# Patient Record
Sex: Female | Born: 1990 | Race: Asian | Hispanic: No | Marital: Married | Smoking: Never smoker
Health system: Southern US, Community
[De-identification: ages and names within clinical notes are randomized; demographics above are authoritative.]

## PROBLEM LIST (undated history)

## (undated) DIAGNOSIS — Z789 Other specified health status: Secondary | ICD-10-CM

## (undated) HISTORY — PX: NO PAST SURGERIES: SHX2092

---

## 2020-03-25 ENCOUNTER — Encounter (HOSPITAL_COMMUNITY): Payer: Self-pay | Admitting: Emergency Medicine

## 2020-03-25 ENCOUNTER — Other Ambulatory Visit: Payer: Self-pay

## 2020-03-25 ENCOUNTER — Inpatient Hospital Stay (HOSPITAL_COMMUNITY)
Admission: AD | Admit: 2020-03-25 | Discharge: 2020-03-25 | Disposition: A | Discharge status: Home / Self Care | Payer: Managed Care, Other (non HMO) | Attending: Obstetrics & Gynecology | Admitting: Obstetrics & Gynecology

## 2020-03-25 ENCOUNTER — Inpatient Hospital Stay (HOSPITAL_COMMUNITY): Payer: Managed Care, Other (non HMO)

## 2020-03-25 DIAGNOSIS — Z3A01 Less than 8 weeks gestation of pregnancy: Secondary | ICD-10-CM | POA: Insufficient documentation

## 2020-03-25 DIAGNOSIS — Z6790 Unspecified blood type, Rh positive: Secondary | ICD-10-CM

## 2020-03-25 DIAGNOSIS — O209 Hemorrhage in early pregnancy, unspecified: Secondary | ICD-10-CM | POA: Diagnosis not present

## 2020-03-25 DIAGNOSIS — O3680X0 Pregnancy with inconclusive fetal viability, not applicable or unspecified: Secondary | ICD-10-CM

## 2020-03-25 DIAGNOSIS — O4690 Antepartum hemorrhage, unspecified, unspecified trimester: Secondary | ICD-10-CM

## 2020-03-25 HISTORY — DX: Other specified health status: Z78.9

## 2020-03-25 LAB — WET PREP, GENITAL
Clue Cells Wet Prep HPF POC: NONE SEEN
Sperm: NONE SEEN
Trich, Wet Prep: NONE SEEN
Yeast Wet Prep HPF POC: NONE SEEN

## 2020-03-25 LAB — I-STAT BETA HCG BLOOD, ED (MC, WL, AP ONLY): I-stat hCG, quantitative: 52.4 m[IU]/mL — ABNORMAL HIGH (ref <5)

## 2020-03-25 LAB — ABO/RH: ABO/RH(D): O POS

## 2020-03-25 LAB — URINALYSIS, ROUTINE W REFLEX MICROSCOPIC
Bilirubin Urine: NEGATIVE
Glucose, UA: NEGATIVE mg/dL
Ketones, ur: NEGATIVE mg/dL
Nitrite: NEGATIVE
Protein, ur: 100 mg/dL — AB
RBC / HPF: >50 RBC/hpf — ABNORMAL HIGH (ref 0–5)
Specific Gravity, Urine: 1.025 (ref 1.005–1.030)
pH: 6 (ref 5.0–8.0)

## 2020-03-25 LAB — CBC
HCT: 38.5 % (ref 36.0–46.0)
Hemoglobin: 12.9 g/dL (ref 12.0–15.0)
MCH: 30.6 pg (ref 26.0–34.0)
MCHC: 33.5 g/dL (ref 30.0–36.0)
MCV: 91.2 fL (ref 80.0–100.0)
Platelets: 236 10*3/uL (ref 150–400)
RBC: 4.22 MIL/uL (ref 3.87–5.11)
RDW: 11.7 % (ref 11.5–15.5)
WBC: 9.2 10*3/uL (ref 4.0–10.5)
nRBC: 0 % (ref 0.0–0.2)

## 2020-03-25 LAB — HCG, QUANTITATIVE, PREGNANCY: hCG, Beta Chain, Quant, S: 73 m[IU]/mL — ABNORMAL HIGH (ref <5)

## 2020-03-25 NOTE — Discharge Instructions (Signed)

## 2020-03-25 NOTE — ED Triage Notes (Signed)
Pt reports she took a home pregnancy test twice at home 2 weeks ago - both positive. LMP 02/07/20. Pt reports her first obgyn appointment is for May 17th. Pt reports she started having light bleeding yesterday. Today she started having some cramping, lower back pain and increased vaginal bleeding. No clots noted.

## 2020-03-25 NOTE — MAU Note (Signed)
+  HPT.  Started spotting and cramping yesterday, became worse today.

## 2020-03-25 NOTE — ED Provider Notes (Signed)
44 old female G1P0  approximately [redacted] weeks pregnant by dates presents for evaluation of lower abdominal pain and vaginal bleeding.  Began yesterday.  Denies any lightheadedness or dizziness.  She has not been seen by OB/GYN to establish IUP.  Patient appears overall well.  Hemodynamically stable.  Abdomen soft.  I-STAT hCG here in ED positive.  Consult with MAU provider, Erin who agrees to except patient in transfer.   MSE was initiated and I personally evaluated the patient and placed orders (if any) at  5:26 PM on Mar 25, 2020.  The patient appears stable so that the remainder of the MSE may be completed by another provider.   Kaytlen Lightsey A, PA-C 03/25/20 1726    Little, Ambrose Finland, MD 03/25/20 2213

## 2020-03-25 NOTE — MAU Provider Note (Signed)
History     CSN: [id number]  Arrival date and time: 03/25/20 1610   First Provider Initiated Contact with Patient 03/25/20 1812      Chief Complaint  Patient presents with  . Vaginal Bleeding  . Abdominal Pain   29 y.o. G1 @[redacted]w[redacted]d  presenting with VB. Had pink spotting last night then became red and heavier today. She only sees blood when she wipes. Reports menstrual like cramps today. Rates pain 4/10. Has not taken anything for it. Had sex 2 days ago.   OB History    Gravida  1   Para      Term      Preterm      AB      Living        SAB      TAB      Ectopic      Multiple      Live Births              Past Medical History:  Diagnosis Date  . Medical history non-contributory     Past Surgical History:  Procedure Laterality Date  . NO PAST SURGERIES      Family History  Problem Relation Age of Onset  . Asthma Mother   . Healthy Father     Social History   Tobacco Use  . Smoking status: Never Smoker  . Smokeless tobacco: Never Used  Substance Use Topics  . Alcohol use: Never  . Drug use: Never    Allergies: No Known Allergies  Medications Prior to Admission  Medication Sig Dispense Refill Last Dose  . prenatal vitamin w/FE, FA (PRENATAL 1 + 1) 27-1 MG TABS tablet Take 1 tablet by mouth daily at 12 noon.   03/25/2020 at Unknown time    Review of Systems  Gastrointestinal: Positive for abdominal pain.  Genitourinary: Positive for vaginal bleeding.   Physical Exam   Blood pressure 107/69, pulse 83, temperature 97.9 F (36.6 C), temperature source Oral, resp. rate 20, height 5\' 5"  (1.651 m), weight 30.8 kg, last menstrual period 02/07/2020, SpO2 100 %.  Physical Exam  Nursing note and vitals reviewed. Constitutional: She is oriented to person, place, and time. She appears well-developed and well-nourished. No distress (appears comfortable).  HENT:  Head: Normocephalic and atraumatic.  Cardiovascular: Normal rate.  Respiratory: Effort  normal. No respiratory distress.  GI: Soft. She exhibits no distension and no mass. There is no abdominal tenderness. There is no rebound and no guarding.  Genitourinary:    Genitourinary Comments: External: no lesions or erythema Vagina: rugated, pink, moist, scant red bloody discharge, cleared with 1 fox swab Uterus: non enlarged, retroverted, non tender, no CMT Adnexae: no masses, no tenderness left, no tenderness right Cervix closed    Musculoskeletal:        General: Normal range of motion.     Cervical back: Normal range of motion.  Neurological: She is alert and oriented to person, place, and time.  Skin: Skin is warm and dry.  Psychiatric: She has a normal mood and affect.   Results for orders placed or performed during the hospital encounter of 03/25/20 (from the past 24 hour(s))  I-Stat beta hCG blood, ED     Status: Abnormal   Collection Time: 03/25/20  5:07 PM  Result Value Ref Range   I-stat hCG, quantitative 52.4 (H) <5 mIU/mL   Comment 3          Wet prep, genital     Status:  Abnormal   Collection Time: 03/25/20  6:28 PM  Result Value Ref Range   Yeast Wet Prep HPF POC NONE SEEN NONE SEEN   Trich, Wet Prep NONE SEEN NONE SEEN   Clue Cells Wet Prep HPF POC NONE SEEN NONE SEEN   WBC, Wet Prep HPF POC FEW (A) NONE SEEN   Sperm NONE SEEN   Urinalysis, Routine w reflex microscopic     Status: Abnormal   Collection Time: 03/25/20  6:53 PM  Result Value Ref Range   Color, Urine YELLOW YELLOW   APPearance CLOUDY (A) CLEAR   Specific Gravity, Urine 1.025 1.005 - 1.030   pH 6.0 5.0 - 8.0   Glucose, UA NEGATIVE NEGATIVE mg/dL   Hgb urine dipstick LARGE (A) NEGATIVE   Bilirubin Urine NEGATIVE NEGATIVE   Ketones, ur NEGATIVE NEGATIVE mg/dL   Protein, ur 671 (A) NEGATIVE mg/dL   Nitrite NEGATIVE NEGATIVE   Leukocytes,Ua TRACE (A) NEGATIVE   RBC / HPF >50 (H) 0 - 5 RBC/hpf   WBC, UA 6-10 0 - 5 WBC/hpf   Bacteria, UA RARE (A) NONE SEEN   Squamous Epithelial / LPF 0-5 0 -  5  CBC     Status: None   Collection Time: 03/25/20  6:55 PM  Result Value Ref Range   WBC 9.2 4.0 - 10.5 K/uL   RBC 4.22 3.87 - 5.11 MIL/uL   Hemoglobin 12.9 12.0 - 15.0 g/dL   HCT 24.5 80.9 - 98.3 %   MCV 91.2 80.0 - 100.0 fL   MCH 30.6 26.0 - 34.0 pg   MCHC 33.5 30.0 - 36.0 g/dL   RDW 38.2 50.5 - 39.7 %   Platelets 236 150 - 400 K/uL   nRBC 0.0 0.0 - 0.2 %  hCG, quantitative, pregnancy     Status: Abnormal   Collection Time: 03/25/20  6:55 PM  Result Value Ref Range   hCG, Beta Chain, Quant, S 73 (H) <5 mIU/mL  ABO/Rh     Status: None   Collection Time: 03/25/20  6:55 PM  Result Value Ref Range   ABO/RH(D) O POS    No rh immune globuloin      NOT A RH IMMUNE GLOBULIN CANDIDATE, PT RH POSITIVE Performed at [facility], [address] [place], [place] [address]    US OB LESS THAN 14 WEEKS WITH OB TRANSVAGINAL  Result Date: 03/25/2020 CLINICAL DATA:  Vaginal bleeding, quantitative beta HCG 52 EXAM: OBSTETRIC <14 WK [place] AND TRANSVAGINAL OB US TECHNIQUE: Both transabdominal and transvaginal ultrasound examinations were performed for complete evaluation of the gestation as well as the maternal uterus, adnexal regions, and pelvic cul-de-sac. Transvaginal technique was performed to assess early pregnancy. COMPARISON:  None. FINDINGS: Intrauterine gestational sac: None Maternal uterus/adnexae: Uterus is retroverted and unremarkable. Endometrium measures 8 mm. There are no adnexal masses. Left ovary measures 2.7 by 1.9 x 1.7 cm and the right ovary measures 3.0 x 1.8 by 2.1 cm. There is trace free fluid in the cul-de-sac. IMPRESSION: 1. No evidence of live intrauterine pregnancy at this time, not unexpected given low beta hCG levels. Serial beta HCG measurements and follow-up ultrasound may be needed to document live intrauterine pregnancy. 2. No adnexal masses or evidence of ectopic pregnancy. 3. Trace free fluid likely physiologic. Electronically Signed   By: Sharlet Salina M.D.   On:  03/25/2020 19:34   MAU Course  Procedures  MDM Labs and [place] ordered. No IUP or adnexal mass seen on [place],  findings could indicate early pregnancy, ectopic pregnancy, or failed pregnancy-discussed with pt and spouse. Will follow quant in 48 hrs. Stable for discharge home.   Assessment and Plan   1. Pregnancy, location unknown   2. Vaginal bleeding in pregnancy   3. Blood type, Rh positive    Discharge home Follow up at [facility] on 03/28/20 at 0930 for stat qhcg Ectopic/SAB precautions Pelvic rest  Allergies as of 03/25/2020   No Known Allergies     Medication List    TAKE these medications   prenatal vitamin w/FE, FA 27-1 MG Tabs tablet Take 1 tablet by mouth daily at 12 noon.      Julianne Handler, CNM 03/25/2020, 8:02 PM

## 2020-03-26 LAB — GC/CHLAMYDIA PROBE AMP (~~LOC~~) NOT AT ARMC
Chlamydia: NEGATIVE
Comment: NEGATIVE
Comment: NORMAL
Neisseria Gonorrhea: NEGATIVE

## 2020-03-28 ENCOUNTER — Ambulatory Visit: Payer: Managed Care, Other (non HMO)

## 2020-04-07 ENCOUNTER — Encounter: Payer: Self-pay | Admitting: Obstetrics & Gynecology

## 2020-04-07 DIAGNOSIS — Z01419 Encounter for gynecological examination (general) (routine) without abnormal findings: Secondary | ICD-10-CM

## 2020-04-10 ENCOUNTER — Inpatient Hospital Stay (HOSPITAL_COMMUNITY)
Admission: AD | Admit: 2020-04-10 | Discharge: 2020-04-10 | Disposition: A | Discharge status: Home / Self Care | Payer: Managed Care, Other (non HMO) | Attending: Obstetrics and Gynecology | Admitting: Obstetrics and Gynecology

## 2020-04-10 ENCOUNTER — Other Ambulatory Visit: Payer: Self-pay

## 2020-04-10 DIAGNOSIS — Z3A00 Weeks of gestation of pregnancy not specified: Secondary | ICD-10-CM | POA: Insufficient documentation

## 2020-04-10 DIAGNOSIS — O00201 Right ovarian pregnancy without intrauterine pregnancy: Secondary | ICD-10-CM | POA: Insufficient documentation

## 2020-04-10 LAB — COMPREHENSIVE METABOLIC PANEL
ALT: 29 U/L (ref 0–44)
AST: 24 U/L (ref 15–41)
Albumin: 4.1 g/dL (ref 3.5–5.0)
Alkaline Phosphatase: 55 U/L (ref 38–126)
Anion gap: 8 (ref 5–15)
BUN: 17 mg/dL (ref 6–20)
CO2: 25 mmol/L (ref 22–32)
Calcium: 9.3 mg/dL (ref 8.9–10.3)
Chloride: 102 mmol/L (ref 98–111)
Creatinine, Ser: 0.75 mg/dL (ref 0.44–1.00)
GFR calc Af Amer: >60 mL/min (ref >60)
GFR calc non Af Amer: >60 mL/min (ref >60)
Glucose, Bld: 100 mg/dL — ABNORMAL HIGH (ref 70–99)
Potassium: 4.3 mmol/L (ref 3.5–5.1)
Sodium: 135 mmol/L (ref 135–145)
Total Bilirubin: 1.3 mg/dL — ABNORMAL HIGH (ref 0.3–1.2)
Total Protein: 7.8 g/dL (ref 6.5–8.1)

## 2020-04-10 LAB — CBC WITH DIFFERENTIAL/PLATELET
Abs Immature Granulocytes: 0.04 10*3/uL (ref 0.00–0.07)
Basophils Absolute: 0 10*3/uL (ref 0.0–0.1)
Basophils Relative: 0 %
Eosinophils Absolute: 0.3 10*3/uL (ref 0.0–0.5)
Eosinophils Relative: 4 %
HCT: 39.3 % (ref 36.0–46.0)
Hemoglobin: 12.9 g/dL (ref 12.0–15.0)
Immature Granulocytes: 1 %
Lymphocytes Relative: 21 %
Lymphs Abs: 1.7 10*3/uL (ref 0.7–4.0)
MCH: 30.1 pg (ref 26.0–34.0)
MCHC: 32.8 g/dL (ref 30.0–36.0)
MCV: 91.6 fL (ref 80.0–100.0)
Monocytes Absolute: 0.6 10*3/uL (ref 0.1–1.0)
Monocytes Relative: 7 %
Neutro Abs: 5.3 10*3/uL (ref 1.7–7.7)
Neutrophils Relative %: 67 %
Platelets: 251 10*3/uL (ref 150–400)
RBC: 4.29 MIL/uL (ref 3.87–5.11)
RDW: 11.7 % (ref 11.5–15.5)
WBC: 8 10*3/uL (ref 4.0–10.5)
nRBC: 0 % (ref 0.0–0.2)

## 2020-04-10 LAB — HCG, QUANTITATIVE, PREGNANCY: hCG, Beta Chain, Quant, S: 122 m[IU]/mL — ABNORMAL HIGH (ref <5)

## 2020-04-10 MED ORDER — METHOTREXATE FOR ECTOPIC PREGNANCY
50.0000 mg/m2 | Freq: Once | INTRAMUSCULAR | Status: AC
  Administered 2020-04-10: 95 mg via INTRAMUSCULAR
  Filled 2020-04-10: qty 1

## 2020-04-10 NOTE — MAU Note (Signed)
PT is here for Methotrexate

## 2020-04-13 ENCOUNTER — Inpatient Hospital Stay (HOSPITAL_COMMUNITY)
Admission: AD | Admit: 2020-04-13 | Discharge: 2020-04-13 | Disposition: A | Discharge status: Home / Self Care | Payer: Managed Care, Other (non HMO) | Attending: Obstetrics & Gynecology | Admitting: Obstetrics & Gynecology

## 2020-04-13 DIAGNOSIS — O3680X0 Pregnancy with inconclusive fetal viability, not applicable or unspecified: Secondary | ICD-10-CM | POA: Diagnosis present

## 2020-04-13 DIAGNOSIS — O209 Hemorrhage in early pregnancy, unspecified: Secondary | ICD-10-CM | POA: Diagnosis not present

## 2020-04-13 DIAGNOSIS — Z3A00 Weeks of gestation of pregnancy not specified: Secondary | ICD-10-CM | POA: Diagnosis not present

## 2020-04-13 DIAGNOSIS — Z9221 Personal history of antineoplastic chemotherapy: Secondary | ICD-10-CM

## 2020-04-13 LAB — HCG, QUANTITATIVE, PREGNANCY: hCG, Beta Chain, Quant, S: 114 m[IU]/mL — ABNORMAL HIGH (ref <5)

## 2020-04-13 NOTE — MAU Provider Note (Signed)
History   Chief Complaint:  Labs Only   Jessica Hughes is  29 y.o. G1P0 Patient's last menstrual period was 02/07/2020.Jessica Hughes Patient is here for follow up of quantitative HCG and ongoing surveillance of pregnancy status. She is Unknown weeks gestation  by LMP.    Since her last visit, the patient is without new complaint. The patient reports bleeding as  lighter than period.  She denies any pain.  General ROS:  positive for vaginal bleeding  Her previous Quantitative HCG values are:  Results for LATOY, LABRIOLA (MRN [id number]) as of 04/13/2020 13:13  Ref. Range 03/25/2020 18:55 04/10/2020 12:52  HCG, Beta Chain, Quant, S Latest Ref Range: <5 mIU/mL 73 (H) 122 (H)   Physical Exam   Blood pressure 106/64, pulse 80, temperature 98.5 F (36.9 C), temperature source Oral, resp. rate 16, last menstrual period 02/07/2020, SpO2 100 %.  Focused Gynecological Exam: examination not indicated  Labs: Results for orders placed or performed during the hospital encounter of 04/13/20 (from the past 24 hour(s))  hCG, quantitative, pregnancy   Collection Time: 04/13/20 12:16 PM  Result Value Ref Range   hCG, Beta Chain, Quant, S 114 (H) <5 mIU/mL   Reviewed HCG with Dr. Alysia Penna- ok to discharge home and follow up on Day 7 as scheduled. Patient states she was instructed by MD to come to MAU for day 7 labs.  Assessment:   1. Pregnancy of unknown anatomic location   2. History of methotrexate therapy     Plan: -Discharge home in stable condition -Strict ectopic precautions discussed -Patient advised to follow-up with MAU on Wednesday for day 7 labs -Patient may return to MAU as needed or if her condition were to change or worsen  Rolm Bookbinder, CNM 04/13/2020, 1:13 PM

## 2020-04-13 NOTE — MAU Note (Signed)
Pt had to leave. Will call her with results or if she needs to return before day 7. Provider aware.

## 2020-04-13 NOTE — MAU Note (Signed)
Pt is here for repeat HCG (day 4). Reports some period-like bleeding since yesterday. Denies pain.

## 2020-04-14 ENCOUNTER — Inpatient Hospital Stay (HOSPITAL_COMMUNITY)
Admission: AD | Admit: 2020-04-14 | Discharge: 2020-04-14 | Disposition: A | Discharge status: Home / Self Care | Payer: Managed Care, Other (non HMO) | Attending: Obstetrics and Gynecology | Admitting: Obstetrics and Gynecology

## 2020-04-14 ENCOUNTER — Other Ambulatory Visit: Payer: Self-pay

## 2020-04-14 ENCOUNTER — Inpatient Hospital Stay (HOSPITAL_COMMUNITY): Payer: Managed Care, Other (non HMO)

## 2020-04-14 ENCOUNTER — Encounter (HOSPITAL_COMMUNITY): Payer: Self-pay | Admitting: Obstetrics and Gynecology

## 2020-04-14 DIAGNOSIS — O26891 Other specified pregnancy related conditions, first trimester: Secondary | ICD-10-CM

## 2020-04-14 DIAGNOSIS — O009 Unspecified ectopic pregnancy without intrauterine pregnancy: Secondary | ICD-10-CM | POA: Diagnosis not present

## 2020-04-14 DIAGNOSIS — M545 Low back pain: Secondary | ICD-10-CM | POA: Insufficient documentation

## 2020-04-14 DIAGNOSIS — Z3A00 Weeks of gestation of pregnancy not specified: Secondary | ICD-10-CM | POA: Diagnosis not present

## 2020-04-14 DIAGNOSIS — R109 Unspecified abdominal pain: Secondary | ICD-10-CM | POA: Diagnosis not present

## 2020-04-14 LAB — HEMOGLOBIN AND HEMATOCRIT, BLOOD
HCT: 37.4 % (ref 36.0–46.0)
Hemoglobin: 12.3 g/dL (ref 12.0–15.0)

## 2020-04-14 LAB — HCG, QUANTITATIVE, PREGNANCY: hCG, Beta Chain, Quant, S: 92 m[IU]/mL — ABNORMAL HIGH (ref <5)

## 2020-04-14 NOTE — MAU Note (Signed)
Pt had methotrexate last Thursday. Stated she had some increased bleeding like a period the last couple of days and some today but it stopped this afternoon. Reports right sided lower back pain rating it a 4/10. States this worried her and is what made her want to come be seen. States the pain comes and goes. Has not taken any medication for the pain.

## 2020-04-14 NOTE — MAU Provider Note (Signed)
Chief Complaint: Abdominal Pain   First Provider Initiated Contact with Patient 04/14/20 1937     SUBJECTIVE HPI: Jessica Hughes is a 29 y.o. G1P0 at Unknown who presents to Maternity Admissions reporting abdominal pain and low back pain.  She received methotrexate on Thursday for a right ectopic pregnancy.  Had abnormally rising hCGs and on ultrasound in the office had a right adnexal mass measuring 1.6 cm (per patient).  At 6 PM this evening reports sudden onset right-sided abdominal and low back pain.  Has also had some bleeding since yesterday.  Not saturating pads or passing blood clots.  Location: RLQ & back Quality: aching   Severity: 4/10 on pain scale Duration: 2 hours Timing: intermittent Modifying factors: none Associated signs and symptoms: vaginal bleeding  Past Medical History:  Diagnosis Date  . Medical history non-contributory    OB History  Gravida Para Term Preterm AB Living  1            SAB TAB Ectopic Multiple Live Births               # Outcome Date GA Lbr Len/2nd Weight Sex Delivery Anes PTL Lv  1 Current            Past Surgical History:  Procedure Laterality Date  . NO PAST SURGERIES     Social History   Socioeconomic History  . Marital status: Married    Spouse name: Not on file  . Number of children: Not on file  . Years of education: Not on file  . Highest education level: Not on file  Occupational History  . Not on file  Tobacco Use  . Smoking status: Never Smoker  . Smokeless tobacco: Never Used  Substance and Sexual Activity  . Alcohol use: Never  . Drug use: Never  . Sexual activity: Yes  Other Topics Concern  . Not on file  Social History Narrative  . Not on file   Social Determinants of Health   Financial Resource Strain:   . Difficulty of Paying Living Expenses:   Food Insecurity:   . Worried About Programme researcher, broadcasting/film/video in the Last Year:   . Barista in the Last Year:   Transportation Needs:   . Automotive engineer (Medical):   Marland Kitchen Lack of Transportation (Non-Medical):   Physical Activity:   . Days of Exercise per Week:   . Minutes of Exercise per Session:   Stress:   . Feeling of Stress :   Social Connections:   . Frequency of Communication with Friends and Family:   . Frequency of Social Gatherings with Friends and Family:   . Attends Religious Services:   . Active Member of Clubs or Organizations:   . Attends Banker Meetings:   Marland Kitchen Marital Status:   Intimate Partner Violence:   . Fear of Current or Ex-Partner:   . Emotionally Abused:   Marland Kitchen Physically Abused:   . Sexually Abused:    Family History  Problem Relation Age of Onset  . Asthma Mother   . Healthy Father    No current facility-administered medications on file prior to encounter.   Current Outpatient Medications on File Prior to Encounter  Medication Sig Dispense Refill  . prenatal vitamin w/FE, FA (PRENATAL 1 + 1) 27-1 MG TABS tablet Take 1 tablet by mouth daily at 12 noon.     No Known Allergies  I have reviewed patient's Past Medical Hx, Surgical Hx, Family  Hx, Social Hx, medications and allergies.   Review of Systems  Constitutional: Negative.   Gastrointestinal: Positive for abdominal pain.  Genitourinary: Positive for vaginal bleeding.  Musculoskeletal: Positive for back pain.    OBJECTIVE Patient Vitals for the past 24 hrs:  BP Temp Temp src Pulse Resp SpO2 Height Weight  04/14/20 1911 -- -- -- -- -- 100 % -- --  04/14/20 1909 111/65 -- -- 83 17 -- -- --  04/14/20 1852 -- -- -- -- -- -- 5\' 4"  (1.626 m) 77.6 kg  04/14/20 1848 106/73 98.3 F (36.8 C) Oral 85 17 -- -- --  04/14/20 1847 -- -- -- -- -- 100 % -- --   Constitutional: Well-developed, well-nourished female in no acute distress.  Cardiovascular: normal rate & rhythm, no murmur Respiratory: normal rate and effort. Lung sounds clear throughout GI: Abd soft, non-tender, Pos BS x 4. No guarding or rebound tenderness MS:  Extremities nontender, no edema, normal ROM Neurologic: Alert and oriented x 4.      LAB RESULTS Results for orders placed or performed during the hospital encounter of 04/14/20 (from the past 24 hour(s))  Hemoglobin and hematocrit, blood     Status: None   Collection Time: 04/14/20  7:53 PM  Result Value Ref Range   Hemoglobin 12.3 12.0 - 15.0 g/dL   HCT 04/16/20 76.7 - 34.1 %  hCG, quantitative, pregnancy     Status: Abnormal   Collection Time: 04/14/20  7:53 PM  Result Value Ref Range   hCG, Beta Chain, Quant, S 92 (H) <5 mIU/mL    IMAGING 04/16/20 OB Transvaginal  Result Date: 04/14/2020 CLINICAL DATA:  Ectopic pregnancy. Abdominal pain in first trimester pregnancy. Patient received methotrexate, most recently 04/10/2020. EXAM: TRANSVAGINAL OB ULTRASOUND TECHNIQUE: Transvaginal ultrasound was performed for complete evaluation of the gestation as well as the maternal uterus, adnexal regions, and pelvic cul-de-sac. COMPARISON:  Obstetric ultrasound 03/25/2020 FINDINGS: Intrauterine gestational sac: None Yolk sac:  Not Visualized. Embryo:  Not Visualized. Maternal uterus/adnexae: Uterus remains retroverted. Endometrium is thin measuring 5 mm. No fluid or gestational sac in the endometrial canal. The left ovary appears normal measuring 1.7 x 2.3 x 1.6 cm. The right ovary appears normal measuring 3.2 x 1.8 x 3.5 cm. No ovarian or extra ovarian adnexal mass. No significant pelvic free fluid. IMPRESSION: No intrauterine pregnancy. No findings to suggest ectopic pregnancy. No adnexal mass or pelvic free fluid. Electronically Signed   By: 05/25/2020 M.D.   On: 04/14/2020 20:43    MAU COURSE Orders Placed This Encounter  Procedures  . 04/16/2020 OB Transvaginal  . Hemoglobin and hematocrit, blood  . hCG, quantitative, pregnancy  . Diet NPO time specified  . Discharge patient   No orders of the defined types were placed in this encounter.   MDM Patient stable with benign abdominal exam.  Patient  very concerned due to the cost sudden onset of pain and recent ectopic treatment.  Offered labs and ultrasound today.  Ultrasound shows no IUP or adnexal masses.  hCG today down to 92 from 114 yesterday.  We will continue with plan of day 7 hCG and MAU on Wednesday ASSESSMENT 1. Ectopic pregnancy without intrauterine pregnancy, unspecified location   2. Abdominal pain during pregnancy in first trimester     PLAN Discharge home in stable condition. Ectopic precautions Return to MAU on Wednesday for day 7 HCG  Follow-up Information    [facility] Follow up.   Specialty: Obstetrics  and Gynecology Why: Return on Wednesday for labs or sooner for worsening symptoms Contact information: [address] [id number] [place] [phone]         Allergies as of 04/14/2020   No Known Allergies     Medication List    STOP taking these medications   prenatal vitamin w/FE, FA 27-1 MG Tabs tablet        Jorje Guild, NP 04/14/2020  9:00 PM

## 2020-04-14 NOTE — Discharge Instructions (Signed)

## 2020-04-16 ENCOUNTER — Inpatient Hospital Stay (HOSPITAL_COMMUNITY)
Admission: AD | Admit: 2020-04-16 | Discharge: 2020-04-16 | Disposition: A | Discharge status: Home / Self Care | Payer: Managed Care, Other (non HMO) | Attending: Obstetrics and Gynecology | Admitting: Obstetrics and Gynecology

## 2020-04-16 ENCOUNTER — Other Ambulatory Visit: Payer: Self-pay

## 2020-04-16 DIAGNOSIS — O009 Unspecified ectopic pregnancy without intrauterine pregnancy: Secondary | ICD-10-CM

## 2020-04-16 DIAGNOSIS — Z3A00 Weeks of gestation of pregnancy not specified: Secondary | ICD-10-CM | POA: Insufficient documentation

## 2020-04-16 LAB — HCG, QUANTITATIVE, PREGNANCY: hCG, Beta Chain, Quant, S: 71 m[IU]/mL — ABNORMAL HIGH (ref <5)

## 2020-04-16 NOTE — MAU Provider Note (Signed)
Subjective:  Jessica Hughes is a 29 y.o. G1P0 at Unknown who presents today for FU BHCG. She was seen on Apr 13, 2020 for initial MTX dose. Results from that day show no IUP on [place], and HCG 114. She denies vaginal bleeding. She denies abdominal or pelvic pain.   Objective:  Physical Exam  Nursing note and vitals reviewed. Constitutional: She is oriented to person, place, and time. She appears well-developed and well-nourished. No distress.  HENT:  Head: Normocephalic.  Cardiovascular: Normal rate.  Respiratory: Effort normal.  GI: Soft. There is no tenderness.  Neurological: She is alert and oriented to person, place, and time. Skin: Skin is warm and dry.  Psychiatric: She has a normal mood and affect.   Results for orders placed or performed during the hospital encounter of 04/16/20 (from the past 24 hour(s))  hCG, quantitative, pregnancy     Status: Abnormal   Collection Time: 04/16/20  6:01 PM  Result Value Ref Range   hCG, Beta Chain, Quant, S 71 (H) <5 mIU/mL    Assessment/Plan: Ectopic Pregnancy HCG did  Decrease  Appropriately Patient and SO informed of results.  No questions or concerns.  FU in 7 days for: Repeat quant at [facility].  Patient already scheduled and reports appt on June 2nd with Dr. Delrae Sawyers

## 2020-04-16 NOTE — MAU Note (Signed)
.  Jessica Hughes is a 29 y.o.  here in MAU reporting: she is here for day 7 blood work after methotrexate. Denies any pain or vaginal bleeding   Pain score: 0 There were no vitals filed for this visit.   Lab orders placed from triage: QUANT

## 2020-04-16 NOTE — MAU Note (Signed)
Gerrit Heck CNM discussed lab results and POC with pt. CNM gave pt verbal d/c instructions and pt d/c home.

## 2020-06-24 ENCOUNTER — Other Ambulatory Visit: Payer: Self-pay | Admitting: Obstetrics and Gynecology

## 2020-06-24 DIAGNOSIS — Z8759 Personal history of other complications of pregnancy, childbirth and the puerperium: Secondary | ICD-10-CM

## 2020-06-30 ENCOUNTER — Other Ambulatory Visit: Payer: Managed Care, Other (non HMO)

## 2020-09-28 IMAGING — US US OB TRANSVAGINAL
1 series · 15 of 26 positions shown · non-contrast
Comparison: Obstetric ultrasound 03/25/2020

CLINICAL DATA: Ectopic pregnancy. Abdominal pain in first trimester
pregnancy. Patient received methotrexate, most recently 04/10/2020.

EXAM:
TRANSVAGINAL OB ULTRASOUND
TECHNIQUE: Transvaginal ultrasound was performed for complete evaluation of the
gestation as well as the maternal uterus, adnexal regions, and
pelvic cul-de-sac.

[Series 1: us ob transvaginal · 26 acquisitions, 15 frames shown]
[im 1/26]
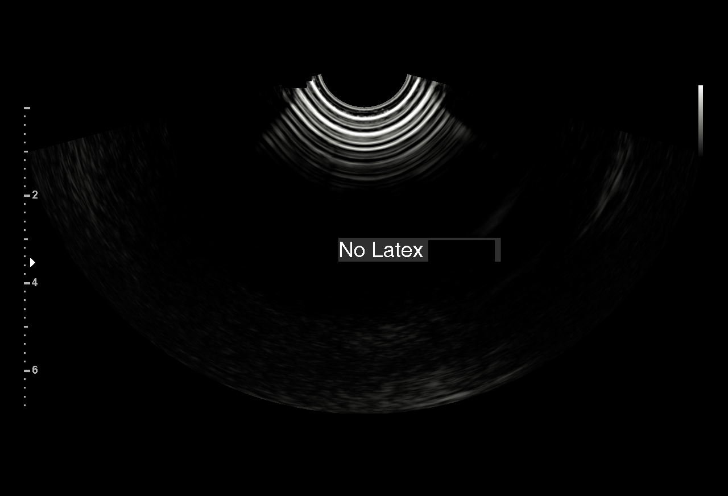
[im 3/26]
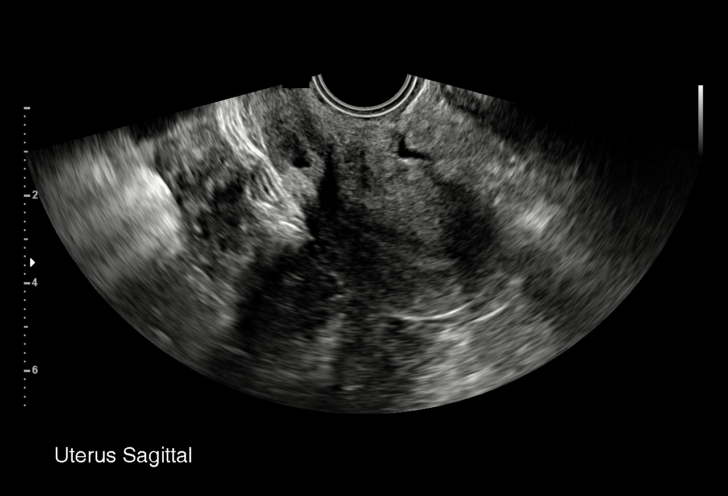
[im 5/26]
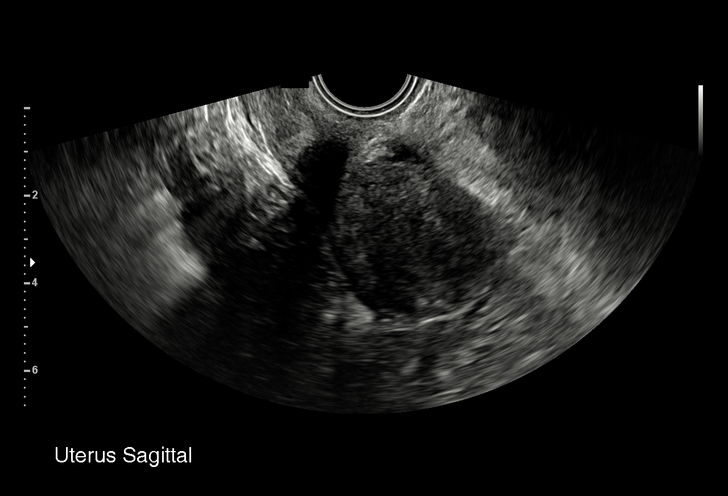
[im 7/26]
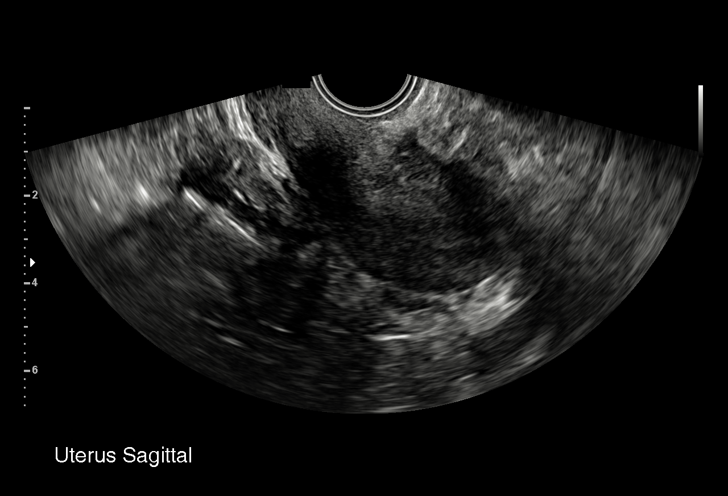
[im 8/26]
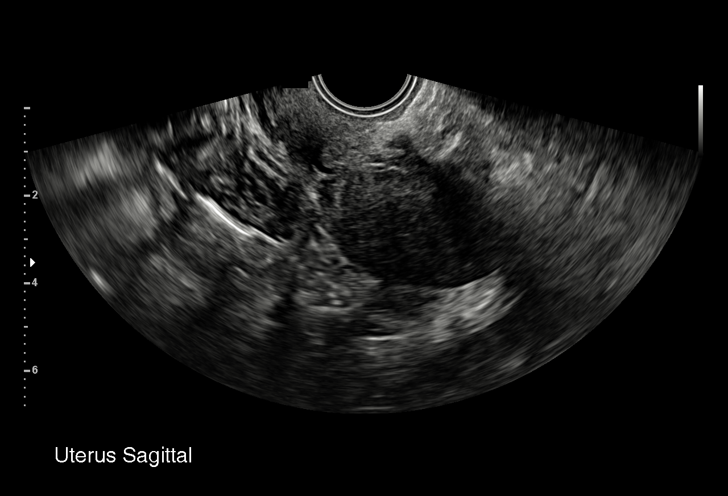
[im 10/26]
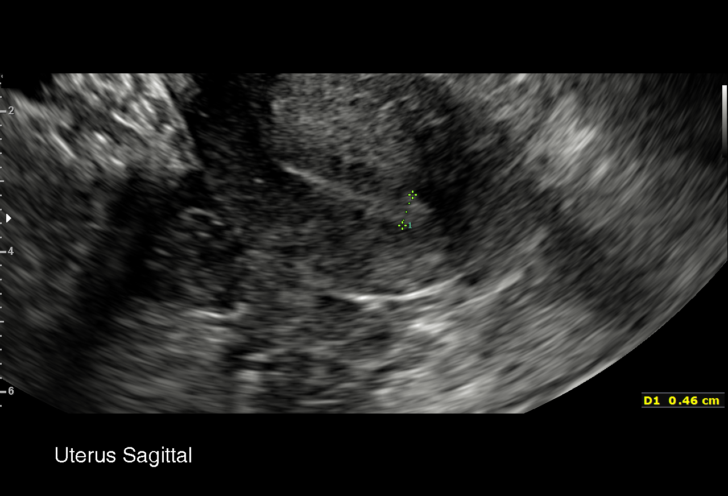
[im 12/26]
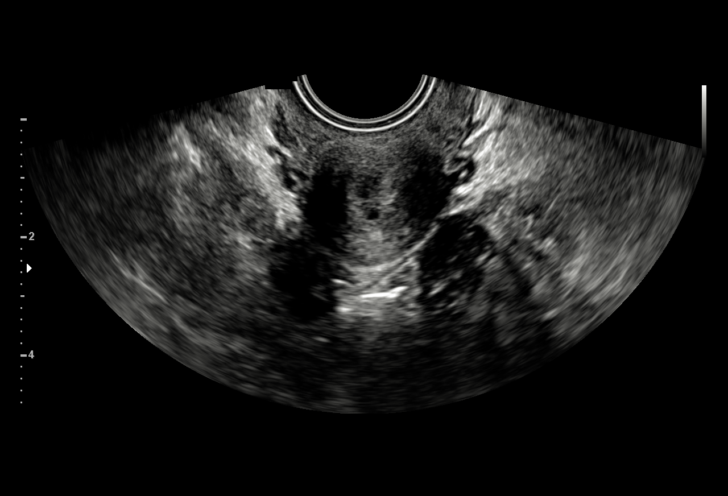
[im 14/26]
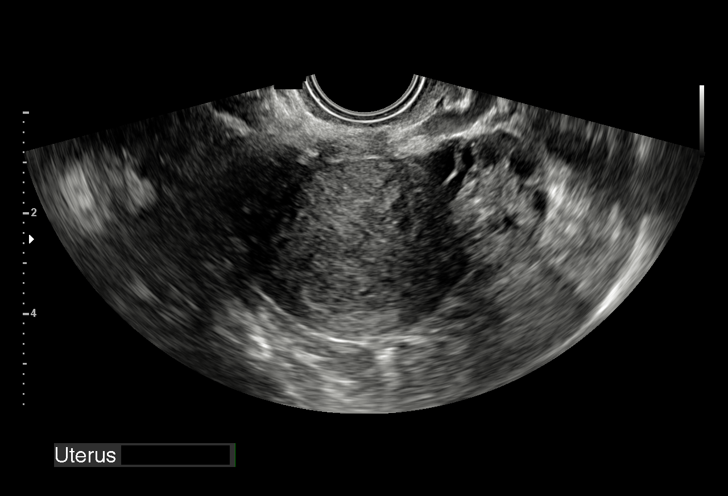
[im 15/26]
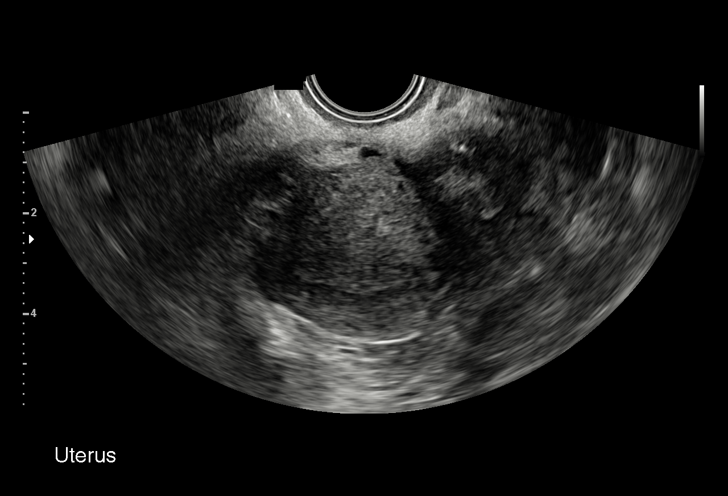
[im 17/26]
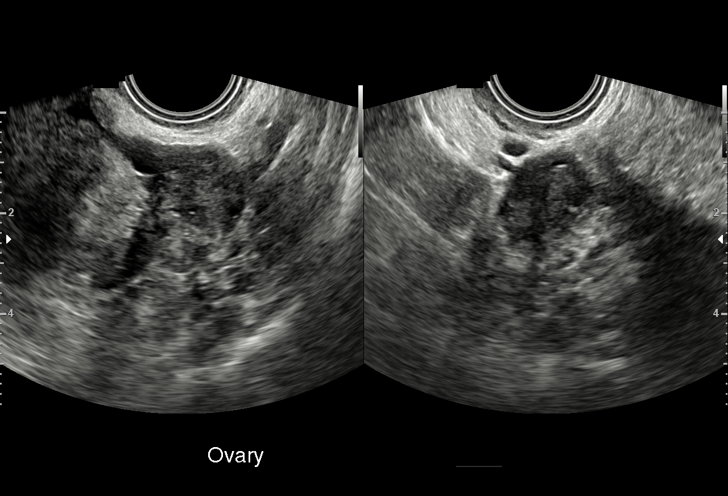
[im 19/26]
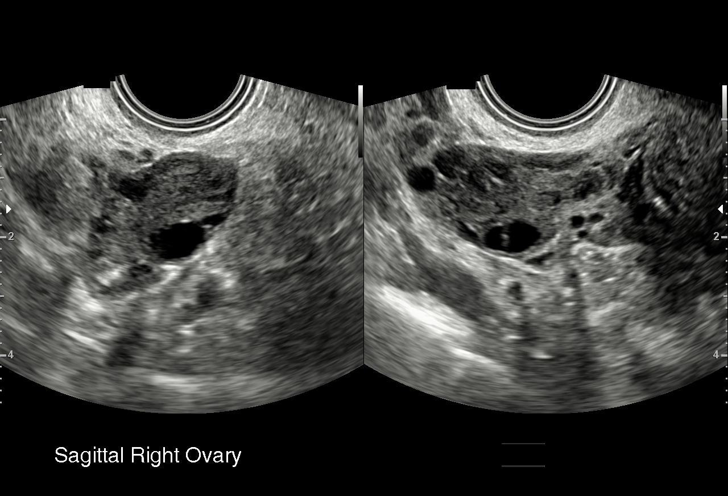
[im 20/26]
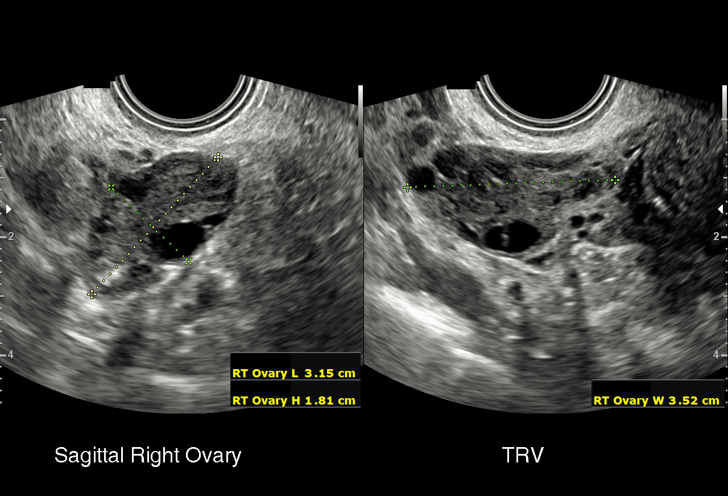
[im 22/26]
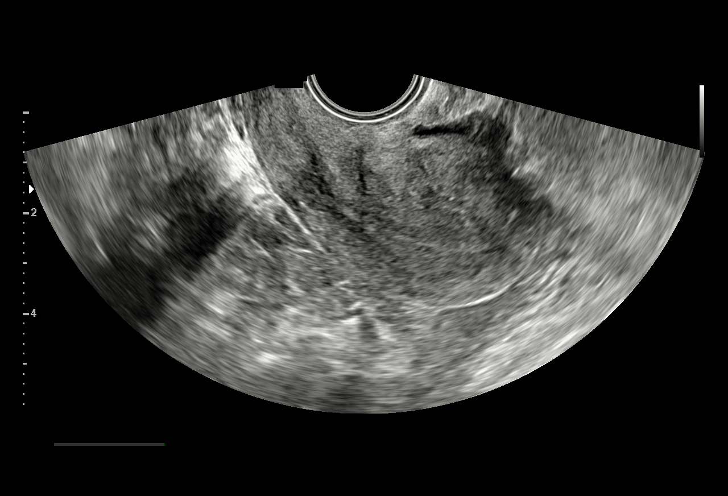
[im 24/26]
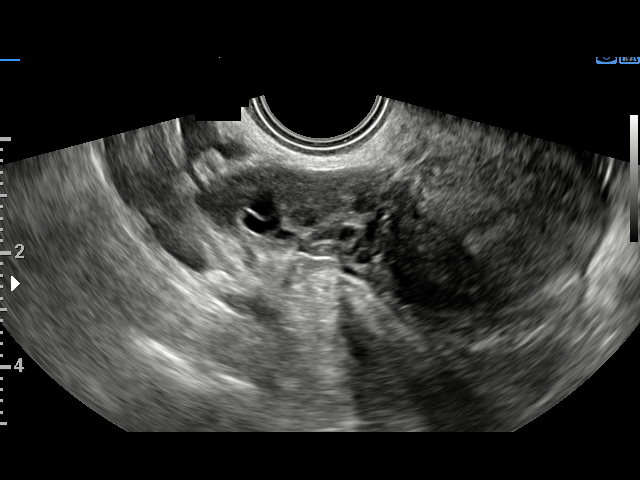
[im 26/26]
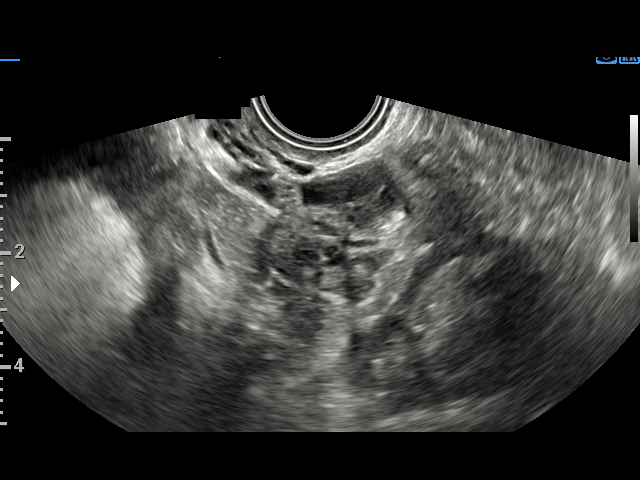

[15 of 26 positions shown; findings below may reference images not displayed]

FINDINGS: Intrauterine gestational sac: None

Yolk sac:  Not Visualized.

Embryo:  Not Visualized.

Maternal uterus/adnexae: Uterus remains retroverted. Endometrium is
thin measuring 5 mm. No fluid or gestational sac in the endometrial
canal. The left ovary appears normal measuring 1.7 x 2.3 x 1.6 cm.
The right ovary appears normal measuring 3.2 x 1.8 x 3.5 cm. No
ovarian or extra ovarian adnexal mass. No significant pelvic free
fluid.
IMPRESSION: No intrauterine pregnancy. No findings to suggest ectopic pregnancy.
No adnexal mass or pelvic free fluid.
